# Patient Record
Sex: Male | Born: 1960 | Race: White | Hispanic: No | Marital: Single | Smoking: Current every day smoker
Health system: Southern US, Community
[De-identification: ages and names within clinical notes are randomized; demographics above are authoritative.]

---

## 1998-02-01 ENCOUNTER — Emergency Department (HOSPITAL_COMMUNITY): Admission: EM | Admit: 1998-02-01 | Discharge: 1998-02-01 | Payer: Self-pay | Admitting: Emergency Medicine

## 1999-01-30 ENCOUNTER — Emergency Department (HOSPITAL_COMMUNITY): Admission: EM | Admit: 1999-01-30 | Discharge: 1999-01-30 | Payer: Self-pay | Admitting: Emergency Medicine

## 1999-01-30 ENCOUNTER — Encounter: Payer: Self-pay | Admitting: Emergency Medicine

## 2000-03-09 ENCOUNTER — Encounter: Payer: Self-pay | Admitting: Emergency Medicine

## 2000-03-09 ENCOUNTER — Emergency Department (HOSPITAL_COMMUNITY): Admission: EM | Admit: 2000-03-09 | Discharge: 2000-03-09 | Payer: Self-pay | Admitting: Emergency Medicine

## 2004-05-02 ENCOUNTER — Other Ambulatory Visit: Payer: Self-pay

## 2007-07-02 ENCOUNTER — Emergency Department: Payer: Self-pay | Admitting: Emergency Medicine

## 2007-07-07 ENCOUNTER — Emergency Department: Payer: Self-pay | Admitting: Emergency Medicine
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
## 2010-11-18 ENCOUNTER — Emergency Department: Payer: Self-pay | Admitting: Emergency Medicine

## 2015-05-15 ENCOUNTER — Emergency Department
Admission: EM | Admit: 2015-05-15 | Discharge: 2015-05-15 | Disposition: A | Discharge status: Home / Self Care | Payer: Self-pay | Attending: Student | Admitting: Student

## 2015-05-15 ENCOUNTER — Encounter: Payer: Self-pay | Admitting: Emergency Medicine

## 2015-05-15 DIAGNOSIS — Z720 Tobacco use: Secondary | ICD-10-CM | POA: Insufficient documentation

## 2015-05-15 DIAGNOSIS — L02413 Cutaneous abscess of right upper limb: Secondary | ICD-10-CM | POA: Insufficient documentation

## 2015-05-15 DIAGNOSIS — Y998 Other external cause status: Secondary | ICD-10-CM | POA: Insufficient documentation

## 2015-05-15 DIAGNOSIS — Y9289 Other specified places as the place of occurrence of the external cause: Secondary | ICD-10-CM | POA: Insufficient documentation

## 2015-05-15 DIAGNOSIS — W57XXXA Bitten or stung by nonvenomous insect and other nonvenomous arthropods, initial encounter: Secondary | ICD-10-CM | POA: Insufficient documentation

## 2015-05-15 DIAGNOSIS — L0291 Cutaneous abscess, unspecified: Secondary | ICD-10-CM

## 2015-05-15 DIAGNOSIS — Y9389 Activity, other specified: Secondary | ICD-10-CM | POA: Insufficient documentation

## 2015-05-15 MED ORDER — LIDOCAINE HCL (PF) 1 % IJ SOLN: 5.0000 mL | Freq: Once | INTRAMUSCULAR | Status: DC

## 2015-05-15 MED ORDER — HYDROCODONE-ACETAMINOPHEN 5-325 MG PO TABS: 1.0000 | ORAL_TABLET | ORAL | Status: AC | PRN

## 2015-05-15 MED ORDER — SULFAMETHOXAZOLE-TRIMETHOPRIM 800-160 MG PO TABS: 1.0000 | ORAL_TABLET | Freq: Two times a day (BID) | ORAL | Status: AC

## 2015-05-15 MED ORDER — LIDOCAINE HCL (PF) 1 % IJ SOLN
INTRAMUSCULAR | Status: AC
  Filled 2015-05-15: qty 5

## 2015-05-15 NOTE — ED Provider Notes (Signed)
[facility] Emergency Department Provider Note ____________________________________________  Time seen: Approximately 9:33 AM  I have reviewed the triage vital signs and the nursing notes.   HISTORY  Chief Complaint Insect Bite   HPI Jeffrey Macias is a 54 y.o. male who presents to the emergency department for evaluation of an insect bite to his right upper arm. He states that he was working under the house about 3 days ago and figures that he has been bitten by a spider. The area has become more red and tender over the last 24 hours. He did not actually see anything bite him. He denies a history of skin infection. He denies fever or nausea.   No past medical history on file.  There are no active problems to display for this patient.   No past surgical history on file.  Current Outpatient Rx  Name  Route  Sig  Dispense  Refill  . HYDROcodone-acetaminophen (NORCO/VICODIN) 5-325 MG per tablet   Oral   Take 1 tablet by mouth every 4 (four) hours as needed.   6 tablet   0   . sulfamethoxazole-trimethoprim (BACTRIM DS,SEPTRA DS) 800-160 MG per tablet   Oral   Take 1 tablet by mouth 2 (two) times daily.   20 tablet   0     Allergies Review of patient's allergies indicates no known allergies.  No family history on file.  Social History Social History  Substance Use Topics  . Smoking status: Current Every Day Smoker -- 0.50 packs/day    Types: Cigarettes  . Smokeless tobacco: Not on file  . Alcohol Use: Yes    Review of Systems   Constitutional: No fever/chills Eyes: No visual changes. ENT: No congestion or rhinorrhea Cardiovascular: Denies chest pain. Respiratory: Denies shortness of breath. Gastrointestinal: No abdominal pain.  No nausea, no vomiting.  No diarrhea.  No constipation. Genitourinary: Negative for dysuria. Musculoskeletal: Negative for back pain. Skin: Positive for abscess on the right upper arm Neurological:  Negative for headaches, focal weakness or numbness.  10-point ROS otherwise negative.  ____________________________________________   PHYSICAL EXAM:  VITAL SIGNS: ED Triage Vitals  Enc Vitals Group     BP 05/15/15 0909 152/96 mmHg     Pulse Rate 05/15/15 0909 88     Resp 05/15/15 0909 18     Temp 05/15/15 0909 98.6 F (37 C)     Temp Source 05/15/15 0909 Oral     SpO2 05/15/15 0909 97 %     Weight 05/15/15 0909 236 lb (107.049 kg)     Height 05/15/15 0909  (1.676 m)     Head Cir --      Peak Flow --      Pain Score 05/15/15 0910 6     Pain Loc --      Pain Edu? --      Excl. in GC? --     Constitutional: Alert and oriented. Well appearing and in no acute distress. Eyes: Conjunctivae are normal. PERRL. EOMI. Head: Atraumatic. Nose: No congestion/rhinnorhea. Mouth/Throat: Mucous membranes are moist.  Oropharynx non-erythematous. No oral lesions. Neck: No stridor. Cardiovascular: Normal rate, regular rhythm.  Good peripheral circulation. Respiratory: Normal respiratory effort.  No retractions. Lungs CTAB. Gastrointestinal: Soft and nontender. No distention. No abdominal bruits.  Musculoskeletal: No lower extremity tenderness nor edema.  No joint effusions. Neurologic:  Normal speech and language. No gross focal neurologic deficits are appreciated. Speech is normal. No gait instability. Skin:  2 cm abscess  noted on the right deltoid area with fluctuance and mild induration, mildly erythematous; Negative for petechiae.  Psychiatric: Mood and affect are normal. Speech and behavior are normal.  ____________________________________________   LABS (all labs ordered are listed, but only abnormal results are displayed)  Labs Reviewed - No data to display ____________________________________________  EKG   ____________________________________________  RADIOLOGY  Not indicated ____________________________________________   PROCEDURES  Procedure(s) performed:   INCISION AND DRAINAGE Performed by: Kem Boroughs Consent: Verbal consent obtained. Risks and benefits: risks, benefits and alternatives were discussed Type: abscess  Body area: Right deltoid  Anesthesia: local infiltration  Incision was made with a scalpel.  Local anesthetic: lidocaine 1 % without epinephrine  Anesthetic total: 4 ml  Complexity: complex Blunt dissection to break up loculations  Drainage: purulent  Drainage amount: Small   Packing material: None   Patient tolerance: Patient tolerated the procedure well with no immediate complications.    ____________________________________________   INITIAL IMPRESSION / ASSESSMENT AND PLAN / ED COURSE  Pertinent labs & imaging results that were available during my care of the patient were reviewed by me and considered in my medical decision making (see chart for details).  Patient was advised to follow up with the primary care provider for symptoms that are not improving over the next 2-3 days. He was advised to return to the emergency department for symptoms that change or worsen if unable to schedule an appointment with the primary care provider. ____________________________________________   FINAL CLINICAL IMPRESSION(S) / ED DIAGNOSES  Final diagnoses:  Abscess       Chinita Pester, FNP 05/15/15 [id number]  Gayla Doss, MD 05/15/15 1530

## 2015-05-15 NOTE — ED Notes (Signed)
Pt reports that he was under a house three days ago and got an inset/spider bite. Reports that it hurts when touched. Pt has small red bump on his right upper arm. Pt alert & oriented with warm, dry skin and NAD.

## 2015-05-15 NOTE — Discharge Instructions (Signed)

## 2015-05-15 NOTE — ED Notes (Signed)
Pt discharged home after verbalizing understanding of discharge instructions; nad noted. 

## 2015-05-15 NOTE — ED Notes (Signed)
Patient reports insect bite that occurred 3 days ago and states area has become more painful since that time.  Patient has a reddened round area about the size of a nickel to his right shoulder.  Patient is in no obvious distress at this time.

## 2015-07-22 DEATH — deceased
